# Patient Record
Sex: Female | Born: 1975 | Race: Asian | Hispanic: No | Marital: Married | Smoking: Never smoker
Health system: Southern US, Community
[De-identification: ages and names within clinical notes are randomized; demographics above are authoritative.]

## PROBLEM LIST (undated history)

## (undated) DIAGNOSIS — Z789 Other specified health status: Secondary | ICD-10-CM

## (undated) HISTORY — PX: NO PAST SURGERIES: SHX2092

---

## 2016-12-14 ENCOUNTER — Encounter (HOSPITAL_COMMUNITY): Payer: Self-pay | Admitting: *Deleted

## 2016-12-15 ENCOUNTER — Other Ambulatory Visit: Payer: Self-pay

## 2016-12-15 ENCOUNTER — Ambulatory Visit (HOSPITAL_COMMUNITY)
Admission: RE | Admit: 2016-12-15 | Discharge: 2016-12-15 | Disposition: A | Discharge status: Home / Self Care | Payer: BLUE CROSS/BLUE SHIELD | Source: Ambulatory Visit | Attending: Obstetrics and Gynecology | Admitting: Obstetrics and Gynecology

## 2016-12-15 ENCOUNTER — Encounter (HOSPITAL_COMMUNITY): Payer: Self-pay

## 2016-12-15 VITALS — BP 116/71 | HR 84 | Ht 59.5 in | Wt 117.2 lb

## 2016-12-15 DIAGNOSIS — Z3A21 21 weeks gestation of pregnancy: Secondary | ICD-10-CM

## 2016-12-15 DIAGNOSIS — O361920 Maternal care for other isoimmunization, second trimester, not applicable or unspecified: Secondary | ICD-10-CM | POA: Insufficient documentation

## 2016-12-15 DIAGNOSIS — Z3A20 20 weeks gestation of pregnancy: Secondary | ICD-10-CM

## 2016-12-15 DIAGNOSIS — O09522 Supervision of elderly multigravida, second trimester: Secondary | ICD-10-CM | POA: Insufficient documentation

## 2016-12-15 DIAGNOSIS — O361120 Maternal care for Anti-A sensitization, second trimester, not applicable or unspecified: Secondary | ICD-10-CM

## 2016-12-15 DIAGNOSIS — O359XX0 Maternal care for (suspected) fetal abnormality and damage, unspecified, not applicable or unspecified: Secondary | ICD-10-CM

## 2016-12-15 HISTORY — DX: Other specified health status: Z78.9

## 2016-12-15 NOTE — Progress Notes (Signed)
Maternal Fetal Medicine Consultation  Requesting Provider(s): Gaccione  Primary OB: Gaccione Reason for consultation: P1 isoimmunization  HPI: 41 year old P0010 at weeks referred for positive antibody screen. 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 This pregnancy has been complicated by advanced maternal age. A maternal serum marker screen is pending. She underwent [place] at [facility] on 9/18 and it was reported that a cleft lip was seen OB History: OB History    Gravida Para Term Preterm AB Living   2       1     SAB TAB Ectopic Multiple Live Births   1            She had a complete SAB at 6 weeks. She did not require D&C or blood transfusion  PMH:  Past Medical History:  Diagnosis Date  . Medical history non-contributory     PSH:  Past Surgical History:  Procedure Laterality Date  . NO PAST SURGERIES     Meds: PNV only Allergies: NKDA FH: See EPIC section Soc: See EPIC section  Review of Systems: no vaginal bleeding or cramping/contractions, no LOF, no nausea/vomiting. All other systems reviewed and are negative.  PE:  VS: See EPIC section GEN: well-appearing female ABD: gravid, NT  A/P: P1 isoimmunization I have taken the liberty or rechecking the antibody ID and titer to confirm that this is the P1 autoantibody. Initial blood back evaluation showed this to be a cold-racting antibody with no IgG demonstration. If so, this is rarely if ever associated with hemolytic disease of the newborn. If the ID is confirmed and the titer remains <8 on my confirmatory exam, then the only intervention necessary if a monthly titer. If it remains below 8 then no other evaluation is needed. If it gets to be 8 or greater, then evaluation of middle cerebral artery peak systolic velocities need to be obtained every 2 weeks to assess for developing fetal anemia.  It should be noted that the patient has other  significant diagnoses that may impact the pregnancy. Our office specifically called Dr. Claude Manges office and it was confirmed that they only wanted isoimmunization addressed, and they did not want Korea to perform Korea to assess the possible facial clefting nor address the implications of her advanced maternal age >40 years. Age >40 years carries with it a risk of late stillbirth and growth restriction. Facial clefting can be a sonographic marker for aneuploidies or other genetic abnormalities. Facial clefting can also be associated with other structural anomalies in a nonsyndromic fashion. I am unable to offer any opinions or make recommendations based on an outside [place] report. If you would like [place] to evaluate and offer recommendations concerning her possible facial clefting, please have hr re-appointed for ultrasonography in our office  Thank you for the opportunity to be a part of the care of [facility]. Please contact our office if we can be of further assistance.   I spent approximately 30 minutes with this patient with over 50% of time spent in face-to-face counseling.

## 2016-12-22 LAB — ANTIBODY IDENTIFICATION

## 2016-12-25 ENCOUNTER — Other Ambulatory Visit: Payer: Self-pay

## 2017-01-01 ENCOUNTER — Other Ambulatory Visit (HOSPITAL_COMMUNITY): Payer: Self-pay

## 2017-01-06 ENCOUNTER — Encounter (HOSPITAL_COMMUNITY): Payer: Self-pay

## 2017-03-08 ENCOUNTER — Other Ambulatory Visit (HOSPITAL_COMMUNITY): Payer: Self-pay | Admitting: Obstetrics and Gynecology

## 2017-03-08 DIAGNOSIS — Z3689 Encounter for other specified antenatal screening: Secondary | ICD-10-CM

## 2017-03-08 DIAGNOSIS — Q369 Cleft lip, unilateral: Secondary | ICD-10-CM

## 2017-03-08 DIAGNOSIS — Z3A34 34 weeks gestation of pregnancy: Secondary | ICD-10-CM

## 2017-03-19 ENCOUNTER — Ambulatory Visit (HOSPITAL_COMMUNITY): Payer: BLUE CROSS/BLUE SHIELD

## 2017-03-31 ENCOUNTER — Other Ambulatory Visit (HOSPITAL_COMMUNITY): Payer: Self-pay | Admitting: Obstetrics and Gynecology

## 2017-03-31 ENCOUNTER — Ambulatory Visit (HOSPITAL_COMMUNITY)
Admission: RE | Admit: 2017-03-31 | Discharge: 2017-03-31 | Disposition: A | Discharge status: Home / Self Care | Payer: BLUE CROSS/BLUE SHIELD | Source: Ambulatory Visit | Attending: Obstetrics and Gynecology | Admitting: Obstetrics and Gynecology

## 2017-03-31 ENCOUNTER — Encounter (HOSPITAL_COMMUNITY): Payer: Self-pay

## 2017-03-31 DIAGNOSIS — Z3689 Encounter for other specified antenatal screening: Secondary | ICD-10-CM

## 2017-03-31 DIAGNOSIS — O09513 Supervision of elderly primigravida, third trimester: Secondary | ICD-10-CM

## 2017-03-31 DIAGNOSIS — Z3A36 36 weeks gestation of pregnancy: Secondary | ICD-10-CM

## 2017-03-31 DIAGNOSIS — O09523 Supervision of elderly multigravida, third trimester: Secondary | ICD-10-CM

## 2017-03-31 DIAGNOSIS — Q369 Cleft lip, unilateral: Secondary | ICD-10-CM

## 2017-03-31 DIAGNOSIS — O289 Unspecified abnormal findings on antenatal screening of mother: Secondary | ICD-10-CM

## 2017-03-31 DIAGNOSIS — Z3A34 34 weeks gestation of pregnancy: Secondary | ICD-10-CM

## 2017-03-31 DIAGNOSIS — O283 Abnormal ultrasonic finding on antenatal screening of mother: Secondary | ICD-10-CM

## 2017-03-31 NOTE — Addendum Note (Signed)
Encounter addended by: Melynda KellerVics, Algenis Ballin R, RDMS on: 03/31/2017 2:10 PM  Actions taken: Imaging Exam ended

## 2017-03-31 NOTE — Progress Notes (Signed)
DOB: 1975-08-21 Referring Provider: Eustace Quail* Appointment Date: 03/31/2017 Attending: Dr. Charlsie Merles  [name] and her husband, Andrea Mcgrath, were seen for genetic counseling regarding a maternal age of 42 y.o. and an increased risk for Down syndrome based on a maternal serum Quad screen.  In person Andrea Mcgrath interpreter, Andrea Mcgrath provided Vietnamese/English translation.  In summary:  Discussed AMA and associated risk for fetal aneuploidy  Reviewed results of screening  Down syndrome risk increased  Trisomy 18 risk reduction  Discussed options for additional screening  NIPS - declined  Ultrasound - previously performed  Ultrasound finding of a cleft lip, palate unable to be evaluated  Advanced paternal age  Discussed diagnostic testing options  Amniocentesis - declined  Reviewed family history concerns - none reported  Discussed carrier screening options - declined  They were counseled regarding maternal age and the association with risk for chromosome conditions due to nondisjunction with aging of the ova.   We reviewed chromosomes, nondisjunction, and the associated 1 in 71 risk for fetal aneuploidy related to a maternal age of 42 y.o. at [redacted]w[redacted]d gestation.  They were counseled that the risk for aneuploidy decreases as gestational age increases, accounting for those pregnancies which spontaneously abort.  We specifically discussed Down syndrome (trisomy 93), trisomies 80 and 60, and sex chromosome aneuploidies (47,XXX and 47,XXY) including the common features and prognoses of each.   We also reviewed Andrea Mcgrath's maternal serum Quad screen result and the associated decrease in risk for fetal Down syndrome (1 in 55 to 1 in 66), although this is still in the "at risk" category.  They were counseled regarding other explanations for a screen positive result including normal variation and differences in maternal metabolism.  In addition, we reviewed the screen  adjusted reduction in risks for trisomy 18 and ONTDs.  They understand that Quad screening provides a pregnancy specific risk for Down syndrome, but is not considered to be diagnostic.    We reviewed other available screening options including noninvasive prenatal screening (NIPS) and detailed ultrasound.  Specifically, we discussed that NIPS analyzes cell free fetal DNA found in the maternal circulation. This test is not diagnostic for chromosome conditions, but can provide information regarding the presence or absence of extra fetal DNA for chromosomes 13, 18, 21, X, and Y, and missing fetal DNA for chromosome X and Y (Turner syndrome). Thus, it would not identify or rule out all genetic conditions. The reported detection rate is greater than 99% for Trisomy 21, greater than 98% for Trisomy 18, and is approximately 80% (8 out of 10) for Trisomy 13. The false positive rate is reported to be less than 0.1% for any of these conditions.  In addition, we discussed that ~50-80% of fetuses with Down syndrome and up to 90-95% of fetuses with trisomy 18/13, when well visualized, have detectable anomalies or soft markers by detailed ultrasound.  This couple was also counseled regarding diagnostic testing via amniocentesis.  We reviewed the approximate 1 in 300-500 risk for complications, including spontaneous pregnancy loss, or at this late gestation, preterm delivery.  We discussed the possible results that the tests might provide including: positive, negative, unanticipated, and no result. Finally, they were counseled regarding the cost of each option and potential out of pocket expenses.  We discussed the ultrasound finding of cleft lip and that the palate could not be visualized clearly enough to state if it was intact or not. In normal embryological development, the fetal lip usually  closes by 7-[redacted] weeks gestation and the fetal palate usually closes by [redacted] weeks gestation.  When parts of these structures do not  fuse properly, cleft lip and/or palate (CL/P) results.  CL/P is twice as common in males as it is in females. Approximately 25% (1 in 4) of all cleft lip and/or palate (CL/P) cases are cleft lip only, 50% (1 in 2) are cleft lip and palate, and 25% (1 in 4) are cleft palate only.  The incidence of CL/P varies in different ethnic populations.  In addition to ethnicity, other factors may increase the chance of a CL/P including some prenatal exposures, alcohol and drug use, cigarette smoking, or folic acid deficiency.  CL/P is most often an isolated condition, but can be present in combination with other birth defects possibly as part of a genetic syndrome. Approximately, 7-13% of individuals with a cleft lip and 11-14% of individuals with a cleft lip and palate are born with additional birth defects.  Many genetic syndromes are associated with cleft lip and/or palate which may not be identified on ultrasound and would not be detected by amniocentesis.  For this reason, a genetics evaluation may be recommended sometime after birth in order to assess for an underlying genetic syndrome.  When there is no syndrome as the cause, then the cleft lip or palate is typically suspected to be caused by a combination of genetic and environmental factors (multifactorial inheritance).   We reviewed chromosomes and genes, and examples of chromosome conditions. They understand that amniocentesis allows evaluation of the fetal chromosomes, but cannot detect all genetic conditions.  Specifically, we discussed that single gene conditions are difficult to diagnose prenatally unless a specific condition is suspected based on additional ultrasound findings or family history. We discussed the risks, limitations, and benefits of each screening and testing option. After thoughtful consideration, this couple declined amniocentesis and NIPS at this time.   Andrea Mcgrath was provided with written information regarding cystic fibrosis (CF),  spinal muscular atrophy (SMA) and hemoglobinopathies including the carrier frequency, availability of carrier screening and prenatal diagnosis if indicated.  In addition, we discussed that CF and hemoglobinopathies are routinely screened for as part of the [facility] newborn screening panel.  After further discussion, she declined screening for CF, SMA and hemoglobinopathies.  Both family histories were reviewed and found to be noncontributory for birth defects, intellectual disability, and known genetic conditions. However, Andrea Mcgrath is 42 years of age.  We discussed that advanced paternal age is defined as paternal age greater than or equal to age 38.  Recent large-scale sequencing studies have shown that approximately 80% of de novo point mutations are of paternal origin.  Many studies have demonstrated a strong correlation between increased paternal age and de novo point mutations.  It is estimated that the overall chance for a de novo mutation is ~0.5%.  We also discussed the wide range of conditions which can be caused by new dominant gene mutations (achondroplasia, neurofibromatosis, Marfan syndrome etc.).  They were counseled that genetic testing for each individual single gene condition is not warranted or available unless ultrasound or concerns lend suspicion to a specific condition.    They were also counseled that some literature suggests that APA is also associated with an increase in risk for fetal aneuploidy.  While other literature does not support this, we discussed that a specific risk for aneuploidy other than that based on maternal age cannot be quantified. Lastly, we discussed that newer literature suggests that the risk  for autism spectrum disorders (ASD) may be increased in children born to fathers of APA.  We discussed that ASDs are among the most common neurodevelopmental disorders, with approximately 1 in 85 children meeting criteria for ASD.  Approximately 80% of individuals diagnosed are  female.  There is strong evidence that genetic factors play a critical role in development of ASD.  While there have been recent advances in identifying specific genetic causes of ASD, there are still many individuals for whom the etiology of the ASD is not known.  They understand that at this time there is no reliable, comprehensive genetic testing available for ASD and a specific paternal age related risk can not be quantified.  Without further information regarding the provided family history, an accurate genetic risk cannot be calculated. Further genetic counseling is warranted if more information is obtained.  Ms. Andrea Mcgrath denied exposure to environmental toxins or chemical agents. She denied the use of alcohol, tobacco or street drugs. She denied significant viral illnesses during the course of her pregnancy.  I counseled this couple regarding the above risks and available options.  The approximate face-to-face time with the genetic counselor was 60 minutes.    Andrea Gemmaaragh Ademola Vert, MS,  Certified Genetic Counselor

## 2017-07-17 ENCOUNTER — Encounter (HOSPITAL_COMMUNITY): Payer: Self-pay

## 2019-01-31 IMAGING — US US MFM OB DETAIL+14 WK
1 series · 12 of 28 positions shown · non-contrast
Comparison: none

[Series 1: us mfm ob detail+14 wk · 12 of 56 slices shown]
[im 3/56]
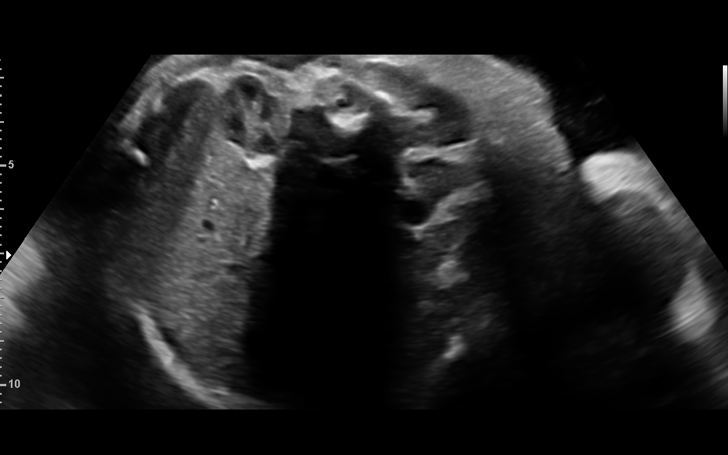
[im 7/56]
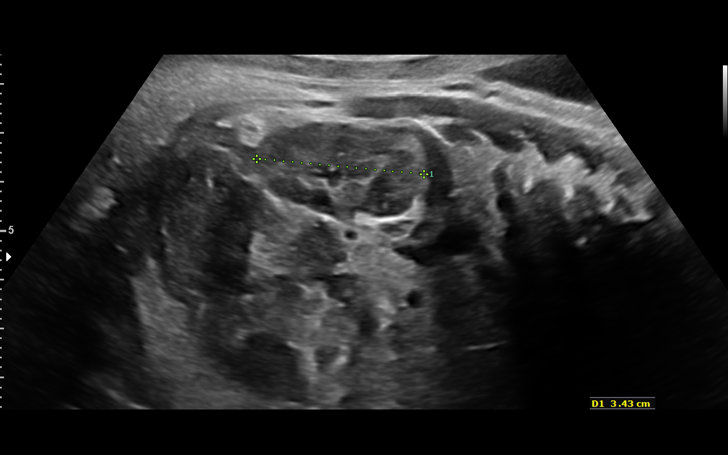
[im 11/56]
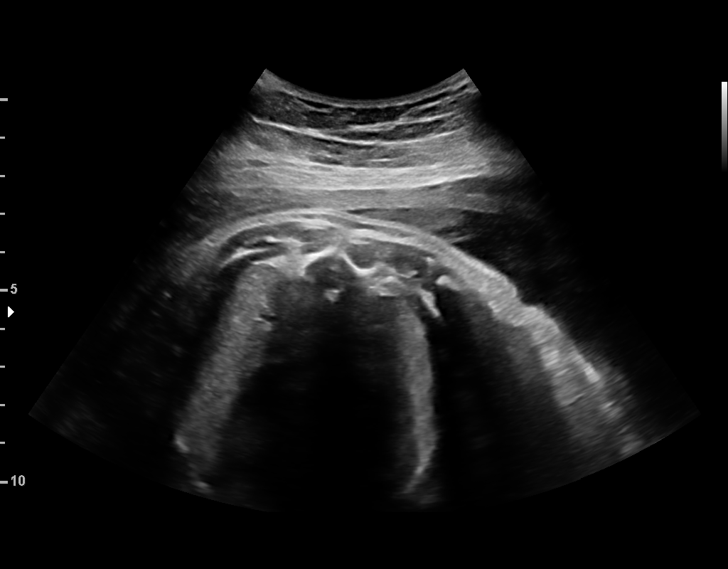
[im 17/56]
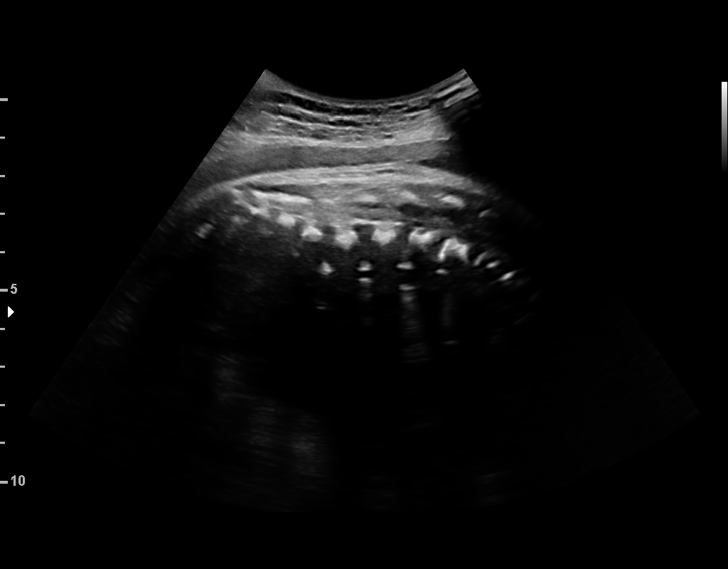
[im 21/56]
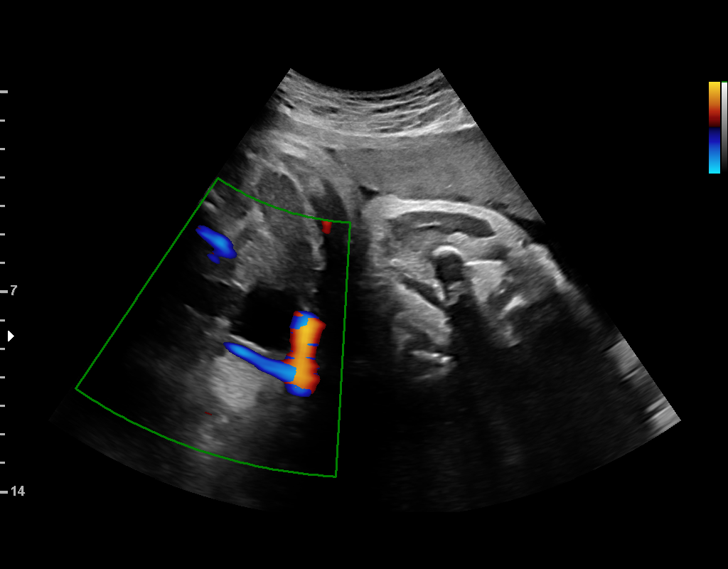
[im 25/56]
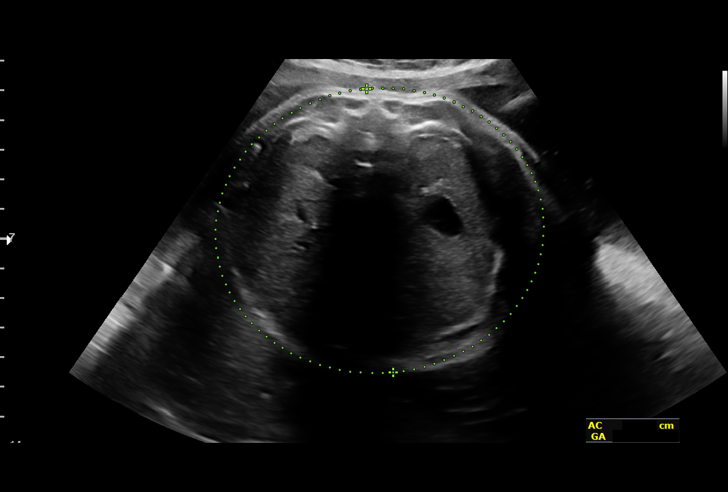
[im 31/56]
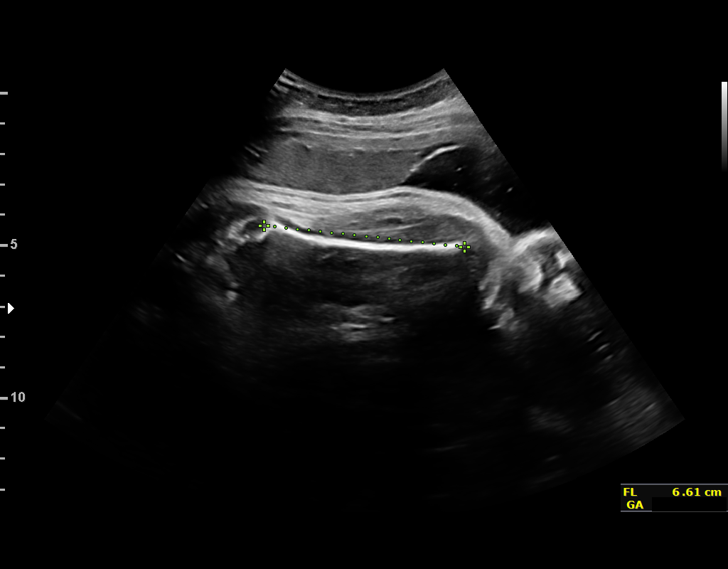
[im 35/56]
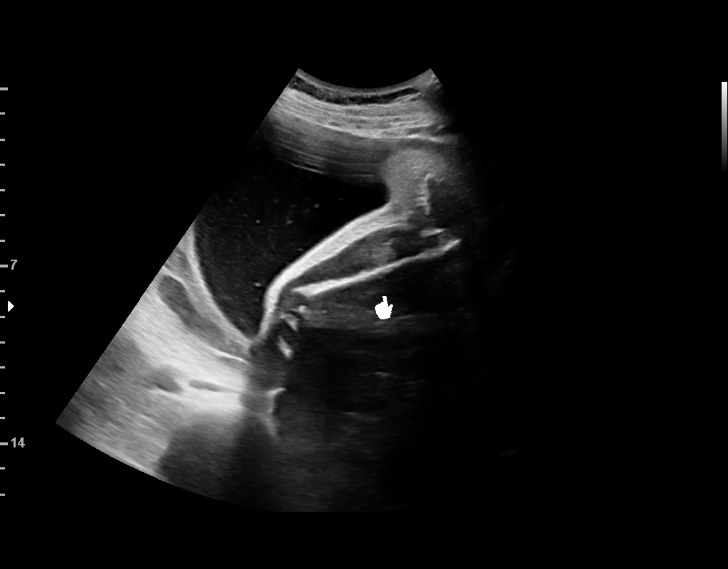
[im 39/56]
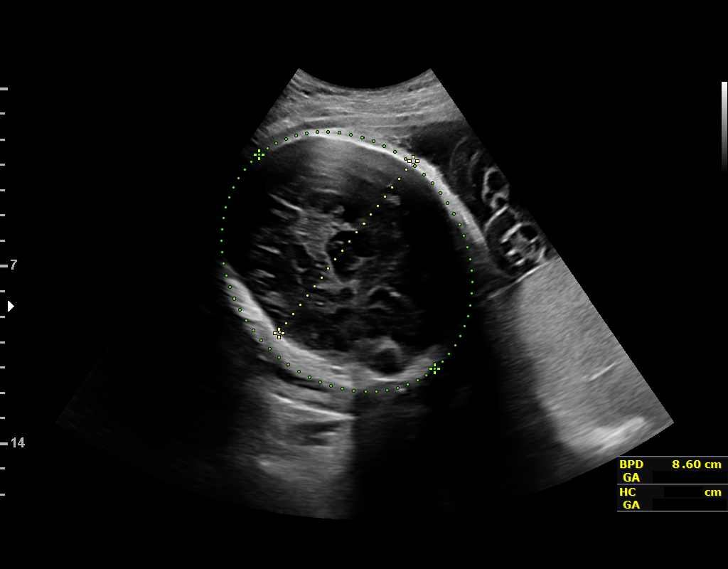
[im 45/56]
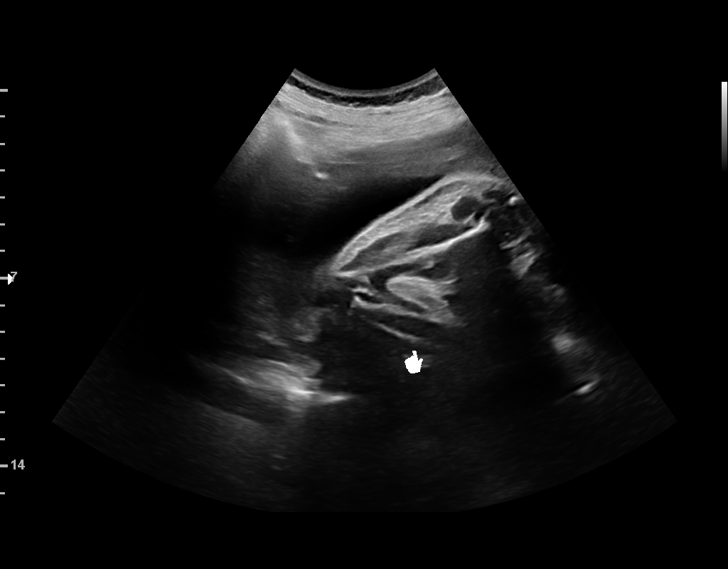
[im 49/56]
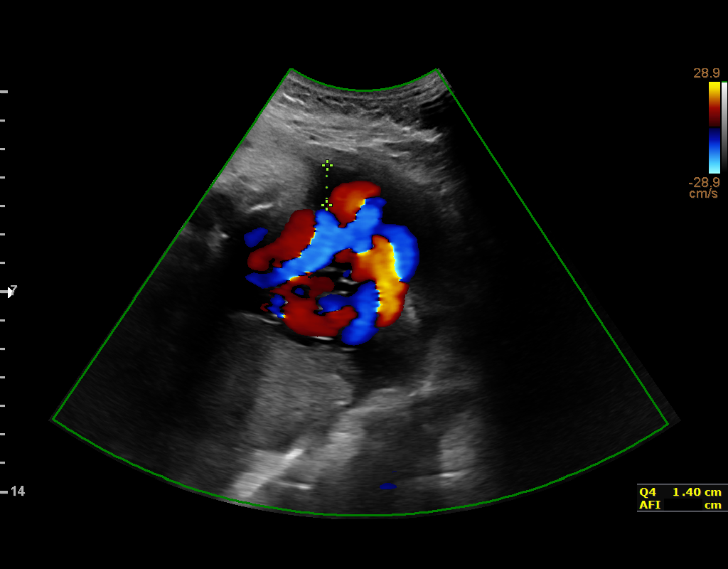
[im 53/56]
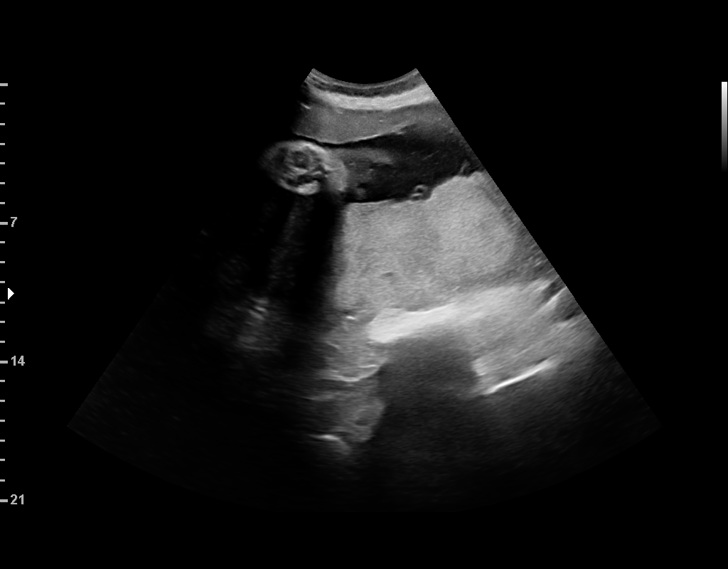

[12 of 28 positions shown; findings below may reference images not displayed]

1  LOKESH TOOMBS             [id number]      [id number]     [id number]
Indications

36 weeks gestation of pregnancy
Encounter for fetal anatomic survey
Advanced maternal age multigravida 35+,
third trimester
Abnormal fetal ultrasound (cleft lip)
Abnormal biochemical screen (quad) for
Trisomy 18, [DATE]
Abnormal biochemical screen (quad) for
Trisomy 21, [DATE]
OB History

Blood Type:            Height:  4'11"  Weight (lb):  117       BMI:
Gravidity:    2          SAB:   1
Living:       0
Fetal Evaluation

Num Of Fetuses:     1
Fetal Heart         142
Rate(bpm):
Cardiac Activity:   Observed
Presentation:       Cephalic
Placenta:           Posterior, above cervical os
P. Cord Insertion:  Not well visualized

Amniotic Fluid
AFI FV:      Subjectively within normal limits

AFI Sum(cm)     %Tile       Largest Pocket(cm)
12.77           44

RUQ(cm)       RLQ(cm)       LUQ(cm)        LLQ(cm)
7.75          1.4           3.62           0
Biometry
BPD:      84.3  mm     G. Age:  33w 6d          5  %    CI:        72.74   %    70 - 86
FL/HC:      20.8   %    20.8 -
HC:      314.3  mm     G. Age:  35w 2d          5  %    HC/AC:      0.95        0.92 -
AC:      329.6  mm     G. Age:  36w 6d         70  %    FL/BPD:     77.6   %    71 - 87
FL:       65.4  mm     G. Age:  33w 5d        < 3  %    FL/AC:      19.8   %    20 - 24
HUM:      59.6  mm     G. Age:  34w 4d         30  %

Est. FW:    7910  gm           6 lb     44  %
Gestational Age

U/S Today:     35w 0d                                        EDD:   05/05/17
Best:          36w 4d     Det. By:  Previous Ultrasound      EDD:   04/24/17
(12/08/16)
Anatomy

Cranium:               Appears normal         Aortic Arch:            Not well visualized
Cavum:                 Not well visualized    Ductal Arch:            Not well visualized
Ventricles:            Not well visualized    Diaphragm:              Not well visualized
Choroid Plexus:        Not well visualized    Stomach:                Appears normal, left
sided
Cerebellum:            Not well visualized    Abdomen:                Appears normal
Posterior Fossa:       Not well visualized    Abdominal Wall:         Not well visualized
Nuchal Fold:           Not applicable (>20    Cord Vessels:           Appears normal (3
wks GA)                                        vessel cord)
Face:                  Not well visualized    Kidneys:                Appear normal
Lips:                  Appears normal         Bladder:                Appears normal
Thoracic:              Appears normal         Spine:                  Limited views
appear normal
Heart:                 Appears normal         Upper Extremities:      Appears normal;
(4CH, axis, and situs                          hands nwv
RVOT:                  Not well visualized    Lower Extremities:      LLE nl; RLE, only
femur seen
LVOT:                  Not well visualized

Other:  Technically difficult due to advanced GA and fetal position.
Cervix Uterus Adnexa

Cervix
Not visualized (advanced GA >22wks)

Uterus
No abnormality visualized.

Left Ovary
Not visualized.

Right Ovary
Not visualized.
Impression

Singleton intrauterine pregnancy at 36+4 weeks with cleft lip
and suspected cleft palate, AMA, and low-titer
isoimmunization
Review of the anatomy shows no sonographic markers for
aneuploidy or structural anomalies except for the face. The
face is poorly visualized but a cleft lip is suspected. Unable to
rule cleft palate in or out on today's scan
Amniotic fluid volume is normal with an AFI of 12.8cm
Estimated fetal weight is 2714g which is growth in the 44th
percentile
Recommendations

I had a long discussion with the patient and her family via an
interpreter. The presence of a cleft lip is usually a purely
cosmetic problem that would not need delivery at a tertiary
care center. Since we cannot adequately evaluate the palate
and prior scan at the OB office are suggestive of cleft palate,
it is in the best intrest of mother and baby to deliver at a site
with NICU care available if a cleft palate is present. I
discussed this with the family and they are amenable to
transfer of care to the MFM practice at [place]. We will set up
an intake appointment ASAP. She will be seeing the genetics
counselor today and that report Ugo be sent separately
